# Patient Record
Sex: Male | Born: 1995 | Race: White | Hispanic: No | Marital: Single | Smoking: Never smoker
Health system: Southern US, Community
[De-identification: ages and names within clinical notes are randomized; demographics above are authoritative.]

---
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
## 2016-03-17 ENCOUNTER — Telehealth: Payer: Self-pay | Admitting: Family Medicine

## 2016-03-17 NOTE — Telephone Encounter (Signed)
error 

## 2016-07-08 IMAGING — CR DG HAND COMPLETE 3+V*L*
1 series · 6 of 6 positions shown · non-contrast
Comparison: None.

CLINICAL DATA: Status post fall yesterday. Injured left ring
finger.

EXAM:
LEFT HAND - COMPLETE 3+ VIEW

[Series 2: pa · 0.17mm/px · 6 of 6 slices shown]
[im 1/6]
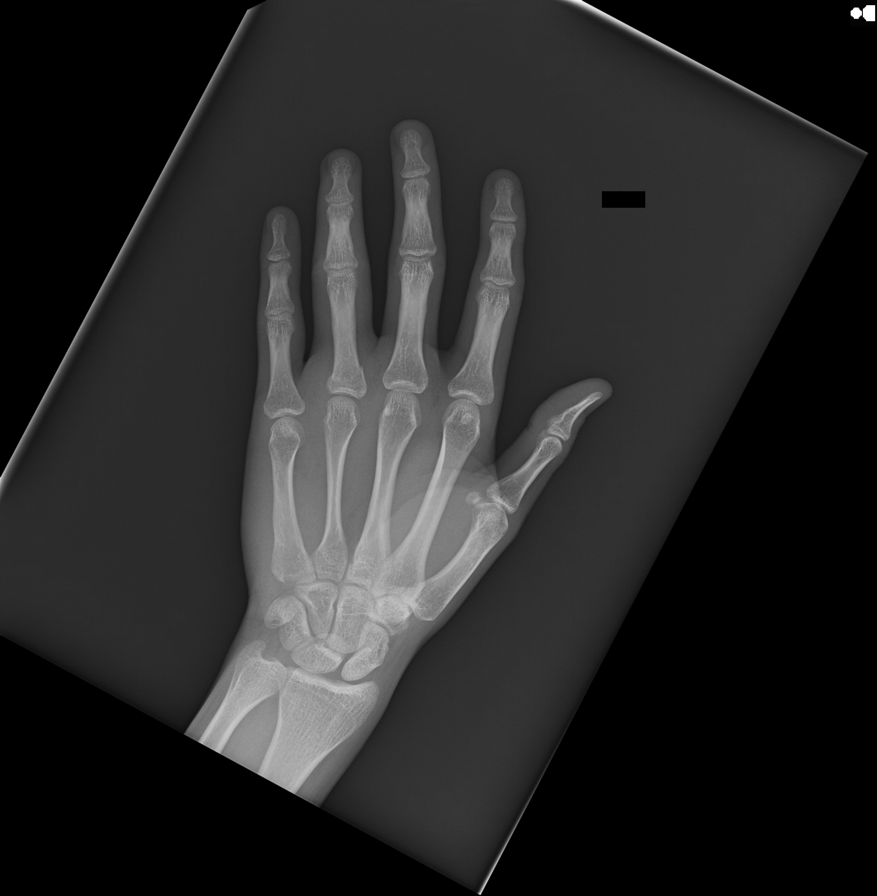
[im 2/6]
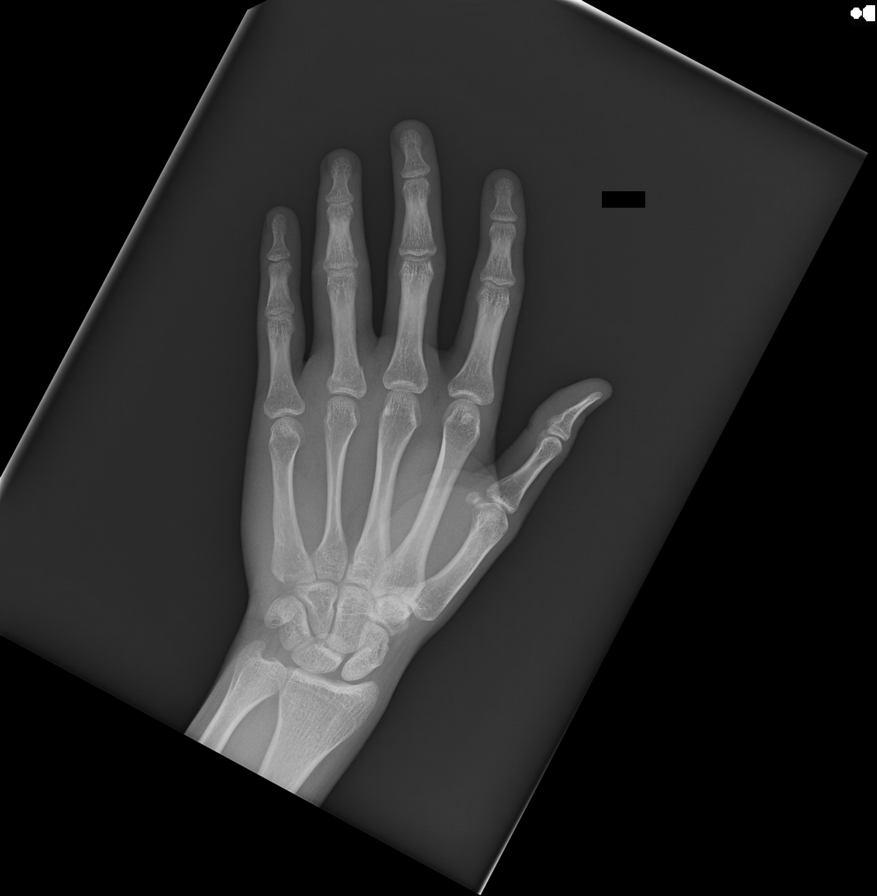
[im 3/6]
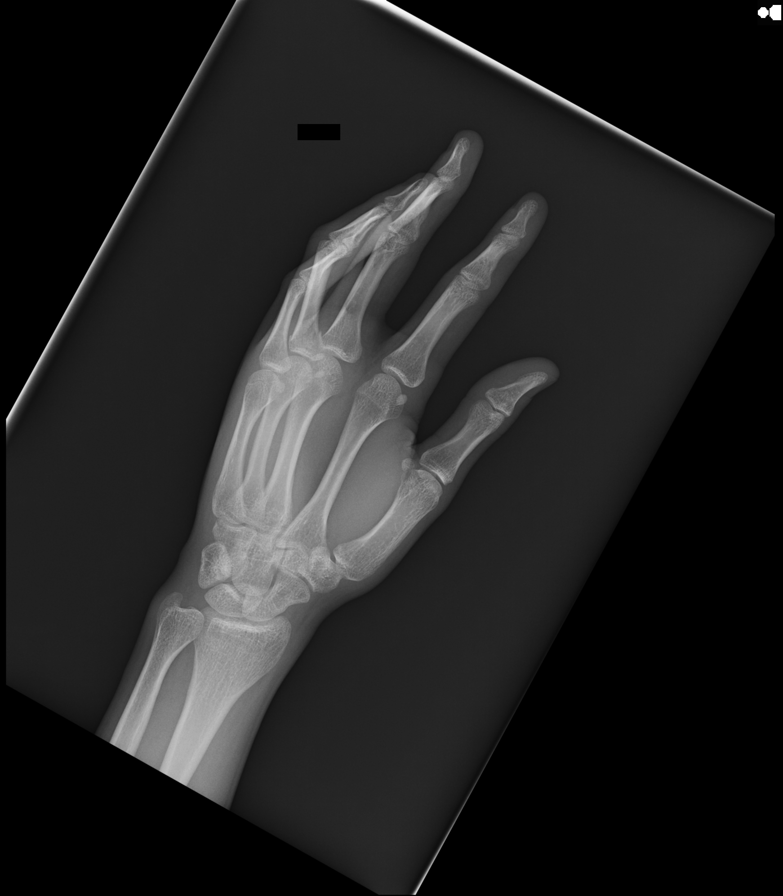
[im 4/6]
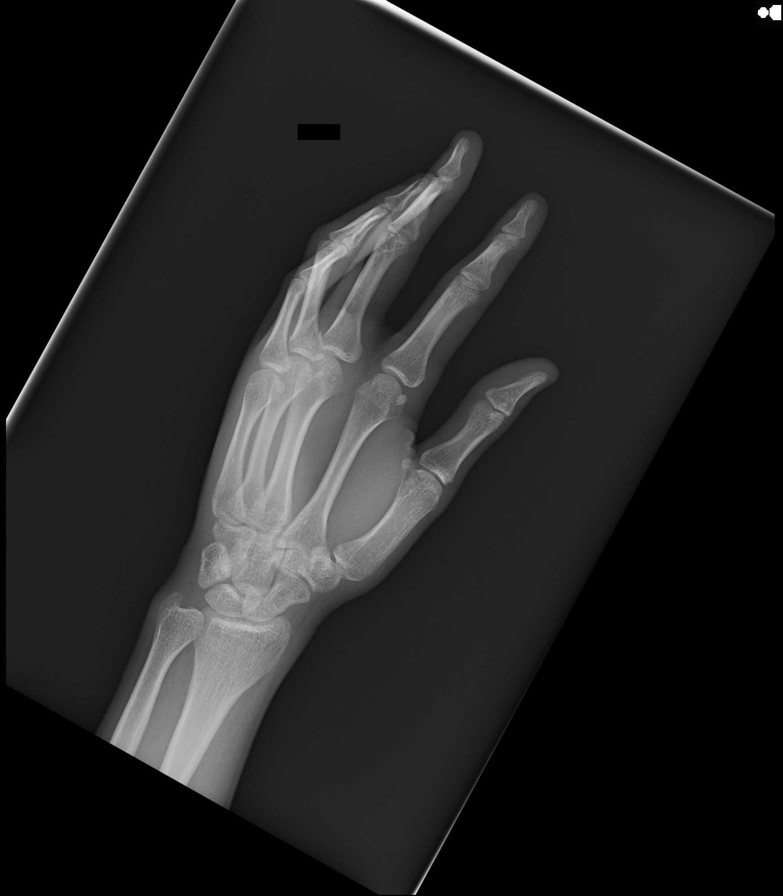
[im 5/6]
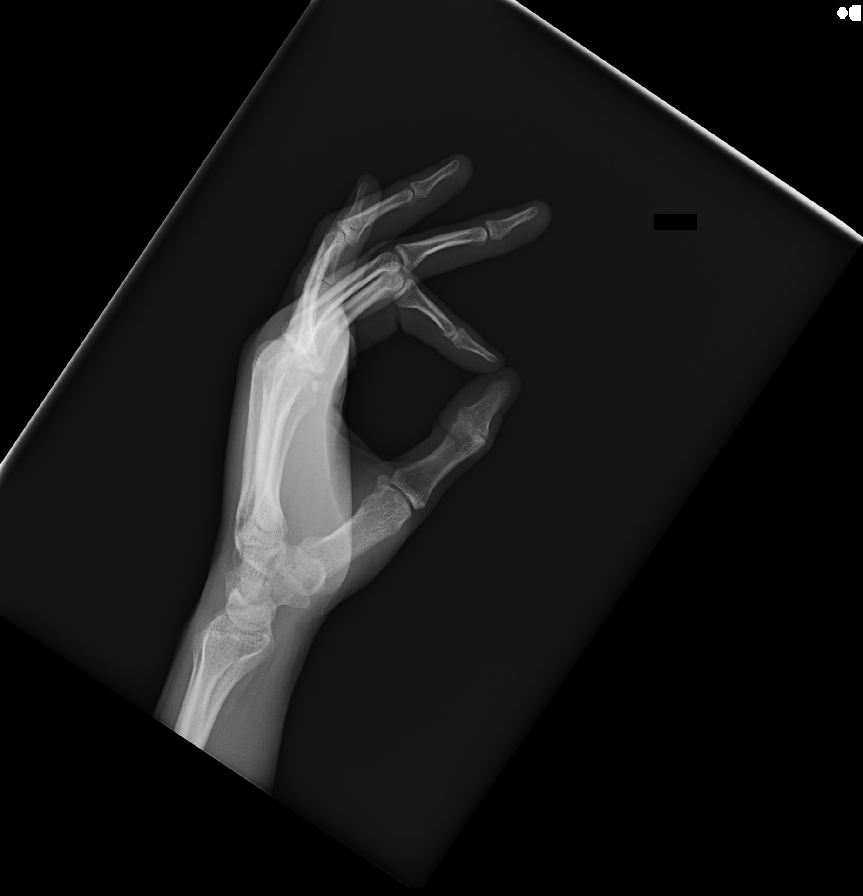
[im 6/6]
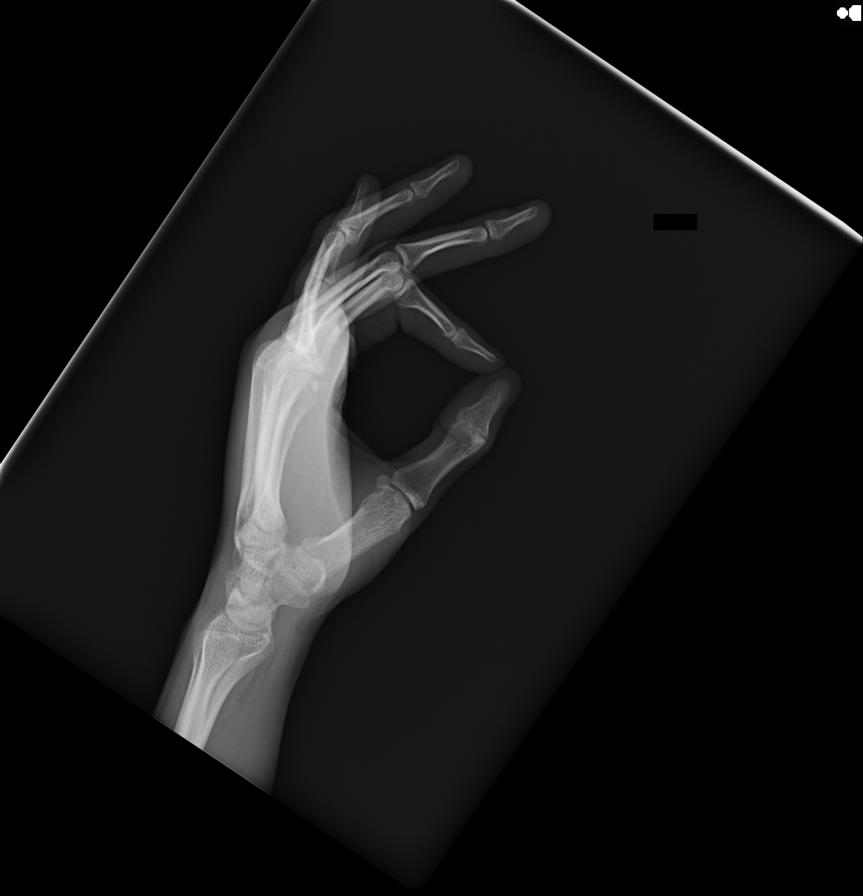

[6 of 6 positions shown; findings below may reference images not displayed]

FINDINGS: Nondisplaced fracture at the base of the left fourth proximal
phalanx with surrounding soft tissue edema. There is no other
fracture or dislocation. There is no evidence of arthropathy or
other focal bone abnormality. Soft tissues are unremarkable.
IMPRESSION: Nondisplaced fracture at the base of the left fourth proximal
phalanx with surrounding soft tissue edema.

## 2016-07-22 IMAGING — CR DG FINGER RING 2+V*L*
1 series · 1 of 1 positions shown · non-contrast
Comparison: 04/10/2015

CLINICAL DATA: Follow-up fracture fourth finger 2 weeks ago

EXAM:
LEFT RING FINGER 2+V

[view not recorded]
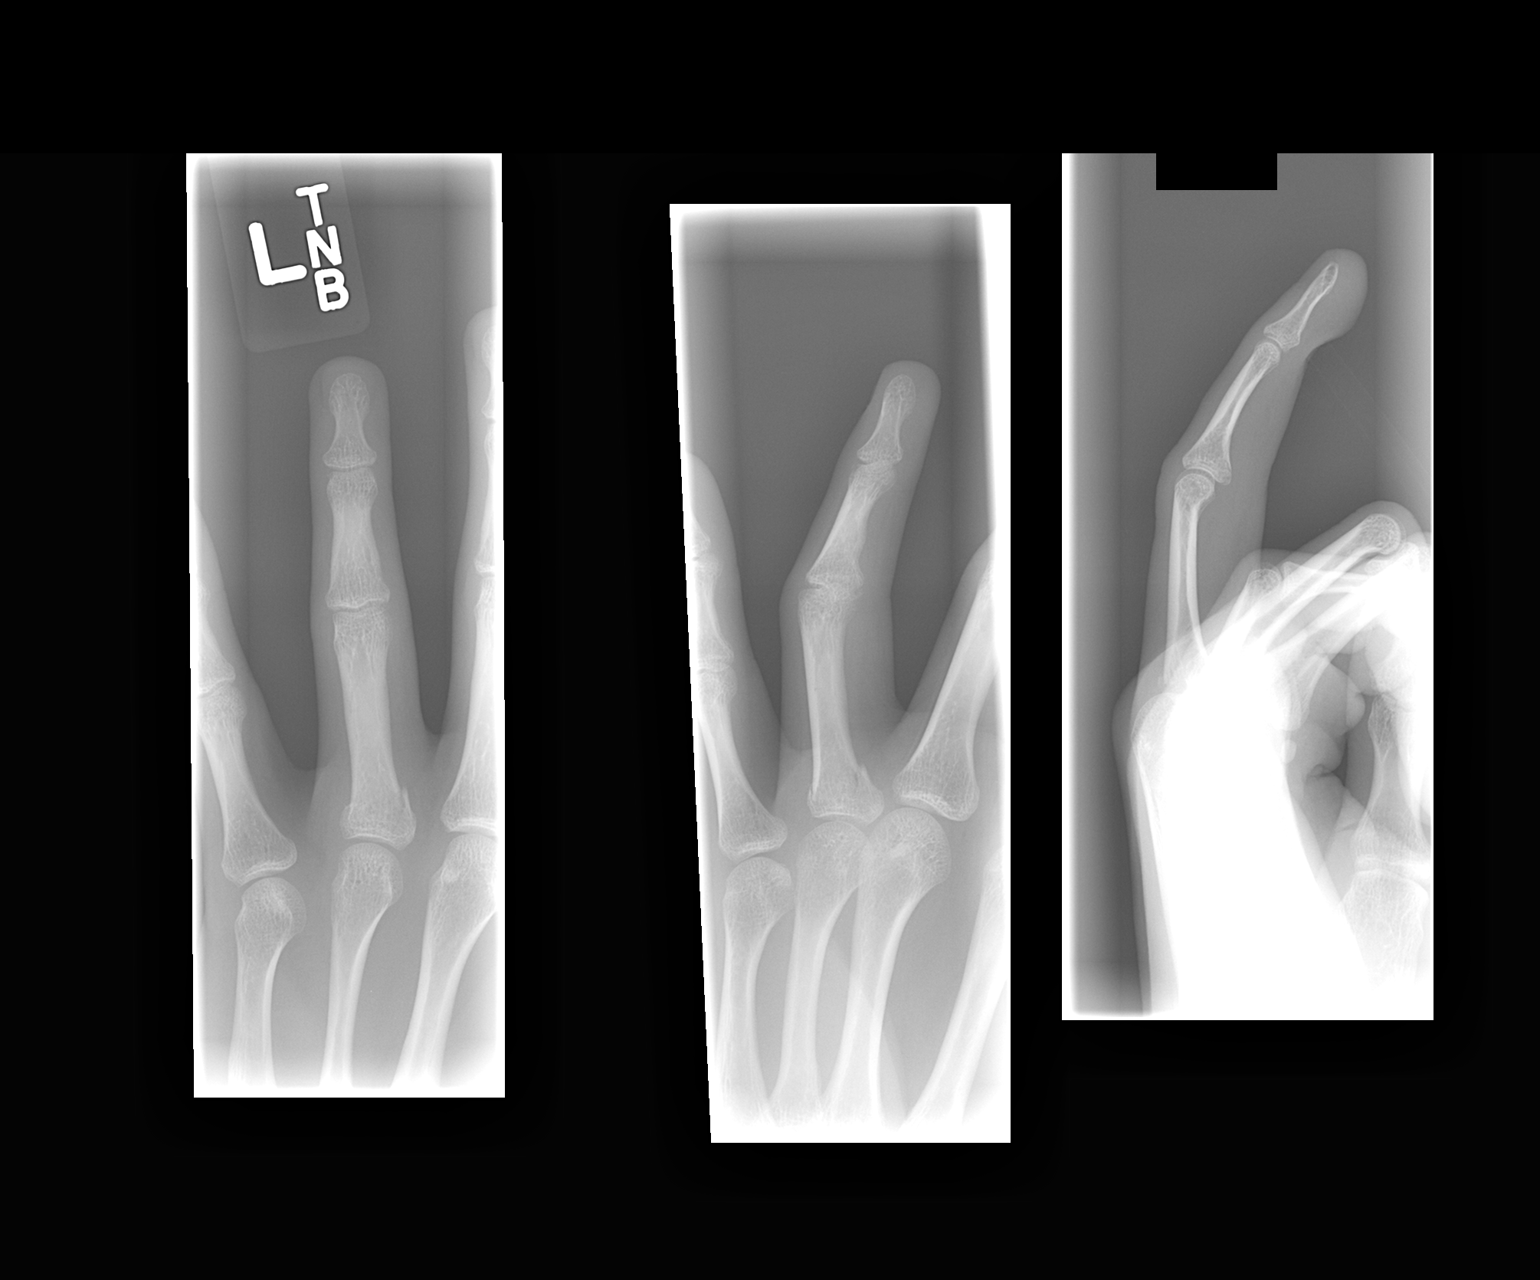

[1 of 1 positions shown; findings below may reference images not displayed]

FINDINGS: Three views of the left fourth finger submitted. Again noted
nondisplaced fracture at the base of proximal phalanx with some
healing response noted. The alignment is preserved.
IMPRESSION: Again noted healing fracture at the base of proximal phalanx left
fourth finger with alignment preserved.

## 2016-07-31 ENCOUNTER — Ambulatory Visit (INDEPENDENT_AMBULATORY_CARE_PROVIDER_SITE_OTHER): Payer: BLUE CROSS/BLUE SHIELD | Admitting: Otolaryngology

## 2016-09-01 ENCOUNTER — Ambulatory Visit (INDEPENDENT_AMBULATORY_CARE_PROVIDER_SITE_OTHER): Payer: BLUE CROSS/BLUE SHIELD | Admitting: Otolaryngology

## 2016-09-01 DIAGNOSIS — R040 Epistaxis: Secondary | ICD-10-CM | POA: Diagnosis not present

## 2016-09-22 ENCOUNTER — Ambulatory Visit (INDEPENDENT_AMBULATORY_CARE_PROVIDER_SITE_OTHER): Payer: BLUE CROSS/BLUE SHIELD | Admitting: Otolaryngology

## 2016-09-29 ENCOUNTER — Ambulatory Visit (INDEPENDENT_AMBULATORY_CARE_PROVIDER_SITE_OTHER): Payer: BLUE CROSS/BLUE SHIELD | Admitting: Otolaryngology

## 2016-09-29 DIAGNOSIS — R040 Epistaxis: Secondary | ICD-10-CM | POA: Diagnosis not present

## 2017-07-16 ENCOUNTER — Encounter: Payer: Self-pay | Admitting: Family Medicine

## 2017-07-16 ENCOUNTER — Ambulatory Visit (INDEPENDENT_AMBULATORY_CARE_PROVIDER_SITE_OTHER): Payer: Managed Care, Other (non HMO) | Admitting: Family Medicine

## 2017-07-16 VITALS — BP 122/82 | Ht 72.0 in | Wt 145.6 lb

## 2017-07-16 DIAGNOSIS — Z23 Encounter for immunization: Secondary | ICD-10-CM | POA: Diagnosis not present

## 2017-07-16 DIAGNOSIS — Z0000 Encounter for general adult medical examination without abnormal findings: Secondary | ICD-10-CM

## 2017-07-16 NOTE — Progress Notes (Signed)
   Subjective:    Patient ID: John Atkinson, male    DOB: 09/14/1996, 21 y.o.   MRN: [id number]  HPI The patient comes in today for a wellness visit. This young man does not smoke Does not drink Does well in school Going to college will be staying in a dormitory He has not had a physical in a long time His last immunizations was when he was in middle school Immunization record reviewed He does need second chickenpox vaccine He also needs meningitis vaccine Patient defers on HPV and hepatitis A information given   A review of their health history was completed.  A review of medications was also completed.  Any needed refills; none  Eating habits: pretty good  Falls/  MVA accidents in past few months: none  Regular exercise: no  Specialist pt sees on regular basis: none  Preventative health issues were discussed.   Additional concerns: none    Review of Systems  Constitutional: Negative for activity change, appetite change and fever.  HENT: Negative for congestion and rhinorrhea.   Eyes: Negative for discharge.  Respiratory: Negative for cough and wheezing.   Cardiovascular: Negative for chest pain.  Gastrointestinal: Negative for abdominal pain, blood in stool and vomiting.  Genitourinary: Negative for difficulty urinating and frequency.  Musculoskeletal: Negative for neck pain.  Skin: Negative for rash.  Allergic/Immunologic: Negative for environmental allergies and food allergies.  Neurological: Negative for weakness and headaches.  Psychiatric/Behavioral: Negative for agitation.       Objective:   Physical Exam  Constitutional: He appears well-developed and well-nourished.  HENT:  Head: Normocephalic and atraumatic.  Right Ear: External ear normal.  Left Ear: External ear normal.  Nose: Nose normal.  Mouth/Throat: Oropharynx is clear and moist.  Eyes: Pupils are equal, round, and reactive to light. EOM are normal.  Neck: Normal range of motion. Neck  supple. No thyromegaly present.  Cardiovascular: Normal rate, regular rhythm and normal heart sounds.   No murmur heard. Pulmonary/Chest: Effort normal and breath sounds normal. No respiratory distress. He has no wheezes.  Abdominal: Soft. Bowel sounds are normal. He exhibits no distension and no mass. There is no tenderness.  Genitourinary: Penis normal.  Musculoskeletal: Normal range of motion. He exhibits no edema.  Lymphadenopathy:    He has no cervical adenopathy.  Neurological: He is alert. He exhibits normal muscle tone.  Skin: Skin is warm and dry. No erythema.  Psychiatric: He has a normal mood and affect. His behavior is normal. Judgment normal.     Genital exam normal no hernia no murmurs      Assessment & Plan:  Adult wellness-complete.wellness physical was conducted today. Importance of diet and exercise were discussed in detail. In addition to this a discussion regarding safety was also covered. We also reviewed over immunizations and gave recommendations regarding current immunization needed for age. In addition to this additional areas were also touched on including: Preventative health exams needed: Colonoscopy Not indicated Chickenpox #2 vaccine given today Meningitis vaccine given today HPV, hepatitis A-information given will think about it  Patient was advised yearly wellness exam

## 2018-11-30 ENCOUNTER — Ambulatory Visit: Payer: Managed Care, Other (non HMO) | Admitting: Family Medicine

## 2018-11-30 NOTE — Progress Notes (Signed)
error 

## 2019-02-02 ENCOUNTER — Ambulatory Visit: Payer: BLUE CROSS/BLUE SHIELD | Admitting: Family Medicine

## 2019-02-02 ENCOUNTER — Encounter: Payer: Self-pay | Admitting: Family Medicine

## 2019-02-02 VITALS — Temp 97.6°F | Wt 157.2 lb

## 2019-02-02 DIAGNOSIS — J019 Acute sinusitis, unspecified: Secondary | ICD-10-CM | POA: Diagnosis not present

## 2019-02-02 MED ORDER — BENZONATATE 100 MG PO CAPS
100.0000 mg | ORAL_CAPSULE | Freq: Three times a day (TID) | ORAL | 0 refills | Status: AC | PRN
Start: 2019-02-02 — End: ?

## 2019-02-02 MED ORDER — AZITHROMYCIN 250 MG PO TABS: ORAL_TABLET | ORAL | 0 refills | Status: AC

## 2019-02-02 NOTE — Progress Notes (Signed)
   Subjective:    Patient ID: John Atkinson, male    DOB: August 14, 1996, 23 y.o.   MRN: [id number]  Sore Throat   This is a new problem. The current episode started in the past 7 days. There has been no fever. Associated symptoms include congestion and coughing. Pertinent negatives include no ear pain or vomiting. Associated symptoms comments: Trouble sleeping. Treatments tried: Motrin. The treatment provided mild relief.   Chest congestion drainage coughing not feeling good symptoms over the past few days denies high fever chills sweats   Review of Systems  Constitutional: Negative for activity change, chills and fever.  HENT: Positive for congestion and rhinorrhea. Negative for ear pain.   Eyes: Negative for discharge.  Respiratory: Positive for cough. Negative for wheezing.   Cardiovascular: Negative for chest pain.  Gastrointestinal: Negative for nausea and vomiting.  Musculoskeletal: Negative for arthralgias.       Objective:   Physical Exam Vitals signs and nursing note reviewed.  Constitutional:      Appearance: He is well-developed.  HENT:     Head: Normocephalic.     Mouth/Throat:     Pharynx: No oropharyngeal exudate.  Neck:     Musculoskeletal: Normal range of motion.  Cardiovascular:     Rate and Rhythm: Normal rate and regular rhythm.     Heart sounds: Normal heart sounds. No murmur.  Pulmonary:     Effort: Pulmonary effort is normal.     Breath sounds: Normal breath sounds. No wheezing.  Lymphadenopathy:     Cervical: No cervical adenopathy.  Skin:    General: Skin is warm and dry.  Neurological:     Motor: No abnormal muscle tone.           Assessment & Plan:  Viral syndrome Secondary rhinosinusitis Antibiotic prescribed warning signs discussed follow-up if ongoing troubles

## 2019-09-16 ENCOUNTER — Other Ambulatory Visit: Payer: Self-pay

## 2019-09-16 DIAGNOSIS — Z20822 Contact with and (suspected) exposure to covid-19: Secondary | ICD-10-CM

## 2019-09-18 LAB — NOVEL CORONAVIRUS, NAA: SARS-CoV-2, NAA: NOT DETECTED

## 2020-01-24 ENCOUNTER — Other Ambulatory Visit: Payer: Self-pay

## 2020-01-24 ENCOUNTER — Ambulatory Visit
Admission: EM | Admit: 2020-01-24 | Discharge: 2020-01-24 | Disposition: A | Discharge status: Home / Self Care | Payer: BC Managed Care – PPO | Attending: Emergency Medicine | Admitting: Emergency Medicine

## 2020-01-24 DIAGNOSIS — J029 Acute pharyngitis, unspecified: Secondary | ICD-10-CM | POA: Diagnosis not present

## 2020-01-24 DIAGNOSIS — Z1152 Encounter for screening for COVID-19: Secondary | ICD-10-CM | POA: Diagnosis not present

## 2020-01-24 MED ORDER — FLUTICASONE PROPIONATE 50 MCG/ACT NA SUSP: 1.0000 | Freq: Every day | NASAL | 0 refills | Status: AC

## 2020-01-24 MED ORDER — CETIRIZINE-PSEUDOEPHEDRINE ER 5-120 MG PO TB12: 1.0000 | ORAL_TABLET | Freq: Every day | ORAL | 0 refills | Status: AC

## 2020-01-24 NOTE — ED Provider Notes (Signed)
[facility]    CSN: [id number] Arrival date & time: 01/24/20  1057      History   Chief Complaint Chief Complaint  Patient presents with  . Sore Throat    HPI John Atkinson is a 24 y.o. male.   Presented to the urgent care for complaint of sore scratchy throat for the past 3 days.  Denies sick exposure to COVID, flu or strep.  Denies recent travel.  Denies aggravating or alleviating symptoms.  Denies previous COVID infection.   Denies fever, chills, fatigue, nasal congestion, rhinorrhea,  cough, SOB, wheezing, chest pain, nausea, vomiting, changes in bowel or bladder habits.     Sore Throat    History reviewed. No pertinent past medical history.  There are no problems to display for this patient.   History reviewed. No pertinent surgical history.     Home Medications    Prior to Admission medications   Medication Sig Start Date End Date Taking? Authorizing Provider  azithromycin (ZITHROMAX Z-PAK) 250 MG tablet Take 2 tablets (500 mg) on  Day 1,  followed by 1 tablet (250 mg) once daily on Days 2 through 5. 02/02/19   Luking, Jonna Coup, MD  benzonatate (TESSALON PERLES) 100 MG capsule Take 1 capsule (100 mg total) by mouth 3 (three) times daily as needed for cough. 02/02/19   Babs Sciara, MD  cetirizine-pseudoephedrine (ZYRTEC-D) 5-120 MG tablet Take 1 tablet by mouth daily. 01/24/20   Ausar Georgiou, Zachery Dakins, FNP  fluticasone (FLONASE) 50 MCG/ACT nasal spray Place 1 spray into both nostrils daily for 14 days. 01/24/20 02/07/20  Durward Parcel, FNP    Family History Family History  Problem Relation Age of Onset  . Healthy Mother   . Healthy Father     Social History Social History   Tobacco Use  . Smoking status: Never Smoker  . Smokeless tobacco: Never Used  Substance Use Topics  . Alcohol use: No  . Drug use: No     Allergies   Patient has no known allergies.   Review of Systems Review of Systems  Constitutional: Negative.   HENT:  Positive for sore throat.   Respiratory: Negative.   Cardiovascular: Negative.   Gastrointestinal: Negative.   Neurological: Negative.      Physical Exam Triage Vital Signs ED Triage Vitals  Enc Vitals Group     BP 01/24/20 1117 114/69     Pulse Rate 01/24/20 1117 75     Resp 01/24/20 1117 15     Temp 01/24/20 1117 97.9 F (36.6 C)     Temp Source 01/24/20 1117 Oral     SpO2 01/24/20 1117 98 %     Weight --      Height --      Head Circumference --      Peak Flow --      Pain Score 01/24/20 1126 0     Pain Loc --      Pain Edu? --      Excl. in GC? --    No data found.  Updated Vital Signs BP 114/69 (BP Location: Right Arm)   Pulse 75   Temp 97.9 F (36.6 C) (Oral)   Resp 15   SpO2 98%   Visual Acuity Right Eye Distance:   Left Eye Distance:   Bilateral Distance:    Right Eye Near:   Left Eye Near:    Bilateral Near:     Physical Exam Vitals and nursing note reviewed.  Constitutional:      General: He is not in acute distress.    Appearance: Normal appearance. He is normal weight. He is not ill-appearing or toxic-appearing.  HENT:     Head: Normocephalic.     Right Ear: Tympanic membrane, ear canal and external ear normal. There is no impacted cerumen.     Left Ear: Tympanic membrane, ear canal and external ear normal. There is no impacted cerumen.     Nose: Nose normal. No congestion.     Mouth/Throat:     Mouth: Mucous membranes are moist.     Pharynx: Oropharynx is clear. No oropharyngeal exudate or posterior oropharyngeal erythema.     Tonsils: 1+ on the right. 1+ on the left.  Cardiovascular:     Rate and Rhythm: Normal rate and regular rhythm.     Pulses: Normal pulses.     Heart sounds: Normal heart sounds. No murmur.  Pulmonary:     Effort: Pulmonary effort is normal. No respiratory distress.     Breath sounds: Normal breath sounds. No wheezing or rhonchi.  Chest:     Chest wall: No tenderness.  Abdominal:     General: Abdomen is flat.  Bowel sounds are normal. There is no distension.     Palpations: There is no mass.     Tenderness: There is no abdominal tenderness.  Skin:    Capillary Refill: Capillary refill takes less than 2 seconds.  Neurological:     General: No focal deficit present.     Mental Status: He is alert and oriented to person, place, and time.      UC Treatments / Results  Labs (all labs ordered are listed, but only abnormal results are displayed) Labs Reviewed  NOVEL CORONAVIRUS, NAA    EKG   Radiology No results found.  Procedures Procedures (including critical care time)  Medications Ordered in UC Medications - No data to display  Initial Impression / Assessment and Plan / UC Course  I have reviewed the triage vital signs and the nursing notes.  Pertinent labs & imaging results that were available during my care of the patient were reviewed by me and considered in my medical decision making (see chart for details).   Patient is stable for discharge. COVID-19 test was ordered. Advised patient to quarantine To go to ED for worsening of symptoms Patient verbalized understanding the plan of care  Final Clinical Impressions(s) / UC Diagnoses   Final diagnoses:  Sore throat  Encounter for screening for COVID-19     Discharge Instructions     COVID testing ordered.  It will take between 2-7 days for test results.  Someone will contact you regarding abnormal results.    In the meantime: You should remain isolated in your home for 10 days from symptom onset AND greater than 72 hours after symptoms resolution (absence of fever without the use of fever-reducing medication and improvement in respiratory symptoms), whichever is longer Get plenty of rest and push fluids Zyrtec-D prescribed for nasal congestion, runny nose, and/or sore throat Flonase prescribed for nasal congestion and runny nose Use medications daily for symptom relief Use OTC medications like ibuprofen or tylenol  as needed fever or pain Call or go to the ED if you have any new or worsening symptoms such as fever, worsening cough, shortness of breath, chest tightness, chest pain, turning blue, changes in mental status, etc...     ED Prescriptions    Medication Sig Dispense Auth. Provider  cetirizine-pseudoephedrine (ZYRTEC-D) 5-120 MG tablet Take 1 tablet by mouth daily. 30 tablet Micki Cassel S, FNP   fluticasone (FLONASE) 50 MCG/ACT nasal spray Place 1 spray into both nostrils daily for 14 days. 16 g Durward Parcel, FNP     PDMP not reviewed this encounter.   Durward Parcel, FNP 01/24/20 1201

## 2020-01-24 NOTE — ED Triage Notes (Signed)
Pt presents with complaints of a sore scratchy throat x 3 days. Denies any other symptoms at this time. Denies any known exposure to covid. Pt reports mild relief with otc treatment.

## 2020-01-24 NOTE — Discharge Instructions (Addendum)
COVID testing ordered.  It will take between 2-7 days for test results.  Someone will contact you regarding abnormal results.    In the meantime: You should remain isolated in your home for 10 days from symptom onset AND greater than 72 hours after symptoms resolution (absence of fever without the use of fever-reducing medication and improvement in respiratory symptoms), whichever is longer Get plenty of rest and push fluids Zyrtec-D prescribed for nasal congestion, runny nose, and/or sore throat Flonase prescribed for nasal congestion and runny nose Use medications daily for symptom relief Use OTC medications like ibuprofen or tylenol as needed fever or pain Call or go to the ED if you have any new or worsening symptoms such as fever, worsening cough, shortness of breath, chest tightness, chest pain, turning blue, changes in mental status, etc...  

## 2020-01-25 LAB — NOVEL CORONAVIRUS, NAA: SARS-CoV-2, NAA: NOT DETECTED
